# Patient Record
Sex: Female | Born: 1935 | Race: White | Hispanic: No | Marital: Single | Smoking: Never smoker
Health system: Southern US, Community
[De-identification: ages and names within clinical notes are randomized; demographics above are authoritative.]

## PROBLEM LIST (undated history)

## (undated) DIAGNOSIS — C50919 Malignant neoplasm of unspecified site of unspecified female breast: Secondary | ICD-10-CM

## (undated) HISTORY — PX: KNEE ARTHROSCOPY: SHX127

## (undated) HISTORY — PX: ABDOMINAL HYSTERECTOMY: SHX81

---
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
## 2008-08-12 DIAGNOSIS — C50919 Malignant neoplasm of unspecified site of unspecified female breast: Secondary | ICD-10-CM

## 2008-08-12 HISTORY — PX: MASTECTOMY: SHX3

## 2008-08-12 HISTORY — DX: Malignant neoplasm of unspecified site of unspecified female breast: C50.919

## 2019-06-06 ENCOUNTER — Other Ambulatory Visit: Payer: Self-pay

## 2019-06-06 ENCOUNTER — Emergency Department (HOSPITAL_COMMUNITY)
Admission: EM | Admit: 2019-06-06 | Discharge: 2019-06-06 | Disposition: A | Discharge status: Home / Self Care | Payer: Medicare PPO | Attending: Emergency Medicine | Admitting: Emergency Medicine

## 2019-06-06 ENCOUNTER — Encounter (HOSPITAL_COMMUNITY): Payer: Self-pay

## 2019-06-06 ENCOUNTER — Emergency Department (HOSPITAL_COMMUNITY): Payer: Medicare PPO

## 2019-06-06 DIAGNOSIS — W19XXXA Unspecified fall, initial encounter: Secondary | ICD-10-CM

## 2019-06-06 DIAGNOSIS — Z853 Personal history of malignant neoplasm of breast: Secondary | ICD-10-CM | POA: Insufficient documentation

## 2019-06-06 DIAGNOSIS — R519 Headache, unspecified: Secondary | ICD-10-CM | POA: Diagnosis present

## 2019-06-06 DIAGNOSIS — W010XXA Fall on same level from slipping, tripping and stumbling without subsequent striking against object, initial encounter: Secondary | ICD-10-CM | POA: Insufficient documentation

## 2019-06-06 DIAGNOSIS — Y92009 Unspecified place in unspecified non-institutional (private) residence as the place of occurrence of the external cause: Secondary | ICD-10-CM

## 2019-06-06 DIAGNOSIS — R531 Weakness: Secondary | ICD-10-CM | POA: Diagnosis not present

## 2019-06-06 DIAGNOSIS — Z20828 Contact with and (suspected) exposure to other viral communicable diseases: Secondary | ICD-10-CM | POA: Diagnosis not present

## 2019-06-06 DIAGNOSIS — M47812 Spondylosis without myelopathy or radiculopathy, cervical region: Secondary | ICD-10-CM | POA: Insufficient documentation

## 2019-06-06 DIAGNOSIS — I959 Hypotension, unspecified: Secondary | ICD-10-CM | POA: Insufficient documentation

## 2019-06-06 DIAGNOSIS — Z96652 Presence of left artificial knee joint: Secondary | ICD-10-CM | POA: Diagnosis not present

## 2019-06-06 DIAGNOSIS — Z9181 History of falling: Secondary | ICD-10-CM | POA: Diagnosis not present

## 2019-06-06 DIAGNOSIS — N3001 Acute cystitis with hematuria: Secondary | ICD-10-CM | POA: Insufficient documentation

## 2019-06-06 DIAGNOSIS — N3000 Acute cystitis without hematuria: Secondary | ICD-10-CM

## 2019-06-06 DIAGNOSIS — M25559 Pain in unspecified hip: Secondary | ICD-10-CM | POA: Diagnosis not present

## 2019-06-06 LAB — URINALYSIS, ROUTINE W REFLEX MICROSCOPIC
Bilirubin Urine: NEGATIVE
Glucose, UA: NEGATIVE mg/dL
Ketones, ur: 80 mg/dL — AB
Nitrite: POSITIVE — AB
Protein, ur: NEGATIVE mg/dL
Specific Gravity, Urine: 1.026 (ref 1.005–1.030)
pH: 6 (ref 5.0–8.0)

## 2019-06-06 LAB — CBC WITH DIFFERENTIAL/PLATELET
Abs Immature Granulocytes: 0.04 10*3/uL (ref 0.00–0.07)
Basophils Absolute: 0 10*3/uL (ref 0.0–0.1)
Basophils Relative: 0 %
Eosinophils Absolute: 0.1 10*3/uL (ref 0.0–0.5)
Eosinophils Relative: 1 %
HCT: 44.5 % (ref 36.0–46.0)
Hemoglobin: 14.3 g/dL (ref 12.0–15.0)
Immature Granulocytes: 0 %
Lymphocytes Relative: 15 %
Lymphs Abs: 1.4 10*3/uL (ref 0.7–4.0)
MCH: 29.6 pg (ref 26.0–34.0)
MCHC: 32.1 g/dL (ref 30.0–36.0)
MCV: 92.1 fL (ref 80.0–100.0)
Monocytes Absolute: 0.9 10*3/uL (ref 0.1–1.0)
Monocytes Relative: 9 %
Neutro Abs: 7 10*3/uL (ref 1.7–7.7)
Neutrophils Relative %: 75 %
Platelets: 258 10*3/uL (ref 150–400)
RBC: 4.83 MIL/uL (ref 3.87–5.11)
RDW: 13.5 % (ref 11.5–15.5)
WBC: 9.3 10*3/uL (ref 4.0–10.5)
nRBC: 0 % (ref 0.0–0.2)

## 2019-06-06 LAB — COMPREHENSIVE METABOLIC PANEL
ALT: 21 U/L (ref 0–44)
AST: 31 U/L (ref 15–41)
Albumin: 3.3 g/dL — ABNORMAL LOW (ref 3.5–5.0)
Alkaline Phosphatase: 68 U/L (ref 38–126)
Anion gap: 13 (ref 5–15)
BUN: 16 mg/dL (ref 8–23)
CO2: 22 mmol/L (ref 22–32)
Calcium: 9.1 mg/dL (ref 8.9–10.3)
Chloride: 103 mmol/L (ref 98–111)
Creatinine, Ser: 0.89 mg/dL (ref 0.44–1.00)
GFR calc Af Amer: >60 mL/min (ref >60)
GFR calc non Af Amer: 57 mL/min — ABNORMAL LOW (ref >60)
Glucose, Bld: 95 mg/dL (ref 70–99)
Potassium: 4.1 mmol/L (ref 3.5–5.1)
Sodium: 138 mmol/L (ref 135–145)
Total Bilirubin: 0.9 mg/dL (ref 0.3–1.2)
Total Protein: 6.8 g/dL (ref 6.5–8.1)

## 2019-06-06 LAB — CK: Total CK: 369 U/L — ABNORMAL HIGH (ref 38–234)

## 2019-06-06 LAB — SARS CORONAVIRUS 2 (TAT 6-24 HRS): SARS Coronavirus 2: NEGATIVE

## 2019-06-06 LAB — LIPASE, BLOOD: Lipase: 17 U/L (ref 11–51)

## 2019-06-06 MED ORDER — CEPHALEXIN 250 MG PO CAPS
500.0000 mg | ORAL_CAPSULE | Freq: Once | ORAL | Status: AC
  Administered 2019-06-06: 500 mg via ORAL
  Filled 2019-06-06: qty 2

## 2019-06-06 MED ORDER — CEPHALEXIN 500 MG PO CAPS: 500.0000 mg | ORAL_CAPSULE | Freq: Two times a day (BID) | ORAL | 0 refills | Status: AC

## 2019-06-06 MED ORDER — SODIUM CHLORIDE 0.9 % IV BOLUS
500.0000 mL | Freq: Once | INTRAVENOUS | Status: AC
  Administered 2019-06-06: 500 mL via INTRAVENOUS

## 2019-06-06 MED ORDER — SODIUM CHLORIDE 0.9 % IV BOLUS
1000.0000 mL | Freq: Once | INTRAVENOUS | Status: AC
  Administered 2019-06-06: 1000 mL via INTRAVENOUS

## 2019-06-06 NOTE — ED Triage Notes (Signed)
Patient arrives via EMS due to having a fall at home. Per EMS, patient had been in the floor for 2 days after a fall (unknown cause). Patients daughter was trying to call patient but could not get an answer. Daughter went to patients apartment and patient couldn't get to the door. Fire department was called and had to force entry. Per ems, patient has positive orthostatic changes.   Patient arrives with bruising to left eye, left arm, and left leg.    Patient states that her daughter moved her here from [place] 1 year ago due to having frequent falls.

## 2019-06-06 NOTE — ED Notes (Signed)
Patient verbalizes understanding of discharge instructions. Opportunity for questioning and answers were provided. pt discharged from ED with daughter.   

## 2019-06-06 NOTE — ED Notes (Signed)
Pt aware that urine sample is needed.  

## 2019-06-06 NOTE — Evaluation (Signed)
Physical Therapy Evaluation Patient Details Name: Tricia Baldwin MRN: [id number] DOB: [birth year] Today's Date: 06/06/2019   History of Present Illness  Pt is a 85 y.o. F with no significant PMH who presents with fall and was down for 48 hours. No apparent injuries or rhabdo.  Clinical Impression  Pt admitted with above. Pt presents with mild confusion, generalized weakness and dynamic balance impairments. Ambulating 150 feet with handheld assist. Recommended walker for all mobility, increased supervision/assist, and Life Alert. Also would benefit from HHPT to maximize functional independence. Orthostatic vitals taken and listed below.  Orthostatic Vital Signs: Supine: 187/78 (101) Sitting: 185/80 (107) Standing: 168/91 (112)    Follow Up Recommendations Home health PT;Supervision for mobility/OOB    Equipment Recommendations  None recommended by PT    Recommendations for Other Services       Precautions / Restrictions Precautions Precautions: Fall Restrictions Weight Bearing Restrictions: No      Mobility  Bed Mobility Overal bed mobility: Needs Assistance Bed Mobility: Supine to Sit;Sit to Supine     Supine to sit: Min assist Sit to supine: Supervision   General bed mobility comments: MinA to pull up to sit  Transfers Overall transfer level: Needs assistance   Transfers: Sit to/from Stand Sit to Stand: Supervision            Ambulation/Gait Ambulation/Gait assistance: Min guard Gait Distance (Feet): 120 Feet Assistive device: 1 person hand held assist Gait Pattern/deviations: Step-through pattern;Decreased stride length Gait velocity: decreased   General Gait Details: Pt mildly unsteady, requiring handheld assist  Stairs            Wheelchair Mobility    Modified Rankin (Stroke Patients Only)       Balance Overall balance assessment: Mild deficits observed, not formally tested                                            Pertinent Vitals/Pain Pain Assessment: No/denies pain    Home Living Family/patient expects to be discharged to:: Private residence Living Arrangements: Alone Available Help at Discharge: Family;Available PRN/intermittently Type of Home: Apartment Home Access: Stairs to enter   Entrance Stairs-Number of Steps: 1 Home Layout: One level Home Equipment: Grab bars - tub/shower;Walker - 4 wheels;Cane - quad;Walker - 2 wheels      Prior Function Level of Independence: Independent               Hand Dominance        Extremity/Trunk Assessment   Upper Extremity Assessment Upper Extremity Assessment: Generalized weakness    Lower Extremity Assessment Lower Extremity Assessment: Generalized weakness       Communication   Communication: No difficulties  Cognition Arousal/Alertness: Awake/alert Behavior During Therapy: WFL for tasks assessed/performed Overall Cognitive Status: Impaired/Different from baseline Area of Impairment: Memory                     Memory: Decreased short-term memory                General Comments      Exercises     Assessment/Plan    PT Assessment Patient needs continued PT services  PT Problem List Decreased strength;Decreased activity tolerance;Decreased balance;Decreased mobility       PT Treatment Interventions DME instruction;Gait training;Functional mobility training;Therapeutic activities;Therapeutic exercise;Patient/family education;Balance training    PT Goals (Current goals can be found  in the Care Plan section)  Acute Rehab PT Goals Patient Stated Goal: "go home." PT Goal Formulation: With patient Time For Goal Achievement: 06/20/19 Potential to Achieve Goals: Good    Frequency Min 3X/week   Barriers to discharge Decreased caregiver support      Co-evaluation               AM-PAC PT "6 Clicks" Mobility  Outcome Measure Help needed turning from your back to your side while in a flat bed  without using bedrails?: None Help needed moving from lying on your back to sitting on the side of a flat bed without using bedrails?: A Little Help needed moving to and from a bed to a chair (including a wheelchair)?: A Little Help needed standing up from a chair using your arms (e.g., wheelchair or bedside chair)?: None Help needed to walk in hospital room?: A Little Help needed climbing 3-5 steps with a railing? : A Little 6 Click Score: 20    End of Session Equipment Utilized During Treatment: Gait belt Activity Tolerance: Patient tolerated treatment well Patient left: in bed;with call bell/phone within reach;with family/visitor present Nurse Communication: Mobility status PT Visit Diagnosis: Unsteadiness on feet (R26.81);History of falling (Z91.81)    Time: 1601-1630 PT Time Calculation (min) (ACUTE ONLY): 29 min   Charges:   PT Evaluation $PT Eval Moderate Complexity: 1 Mod PT Treatments $Therapeutic Activity: 8-22 mins        Tricia Baldwin, PT, DPT Acute Rehabilitation Services Pager [phone] Office [phone]   Tricia Baldwin 06/06/2019, 5:09 PM

## 2019-06-06 NOTE — ED Notes (Signed)
Pt ambulated with the assistance of a walker. She stated she felt "sore but okay." Denied dizziness, lightheadedness, or SOB.

## 2019-06-06 NOTE — Consult Note (Signed)
ER Consult Note   Tricia Baldwin [id number] DOB: July 07, 1927 DOA: 06/06/2019  PCP: No primary care provider on file. - her doctors are in [facility]; has lived here a year but kept insurance there; saw PCP Thursday, Dr. Andee Lineman Consultants: None Patient coming from:  Home - lives alone; NOK: Daughter, [phone]  Chief Complaint: falls  HPI: Tricia Baldwin is a 85 y.o. female with no medical history presenting with falls.   She reports that she fell and hurt her hip, left leg, knee, and eye.  She is unsure why she fell.  She fell in the bedroom about 420AM Friday.  She was down the entire 48+ hours.  Now, her hip hurts but otherwise nothing is bothering her.  Her daughter has noticed that she is steady with level walking but is slower than she used to be.    ED Course:  Moved here a year ago due to falls at home, moved to be closer to family.  Mechanical fall Friday at 0400, unable to get up or get to the phone.  Broke into house and found her.  Bruising left face, knee.  No apparent injuries.  No rhabdo.  Likely needs PT/placement.  Review of Systems: As per HPI; otherwise review of systems reviewed and negative.   Ambulatory Status:  Ambulates without assistance  Past Medical History:  Diagnosis Date   Breast cancer ([place]) 2010    Past Surgical History:  Procedure Laterality Date   ABDOMINAL HYSTERECTOMY     in her 20's   KNEE ARTHROSCOPY Bilateral    MASTECTOMY  2010    Social History   Socioeconomic History   Marital status: Single    Spouse name: Not on file   Number of children: Not on file   Years of education: Not on file   Highest education level: Not on file  Occupational History   Occupation: retired  Scientist, product/process development strain: Not on file   Food insecurity    Worry: Not on file    Inability: Not on Lexicographer needs    Medical: Not on file    Non-medical: Not on file  Tobacco Use   Smoking status: Never  Smoker   Smokeless tobacco: Never Used  Substance and Sexual Activity   Alcohol use: Not Currently   Drug use: Never   Sexual activity: Not on file  Lifestyle   Physical activity    Days per week: Not on file    Minutes per session: Not on file   Stress: Not on file  Relationships   Social connections    Talks on phone: Not on file    Gets together: Not on file    Attends religious service: Not on file    Active member of club or organization: Not on file    Attends meetings of clubs or organizations: Not on file    Relationship status: Not on file   Intimate partner violence    Fear of current or ex partner: Not on file    Emotionally abused: Not on file    Physically abused: Not on file    Forced sexual activity: Not on file  Other Topics Concern   Not on file  Social History Narrative   Not on file    No Known Allergies  History reviewed. No pertinent family history.  Prior to Admission medications   Not on File    Physical Exam: Vitals:   06/06/19 1215 06/06/19  1217 06/06/19 1233 06/06/19 1419  BP: 118/76   (!) 162/70  Pulse: 72   85  Resp: 18   (!) 21  Temp:      TempSrc:      SpO2: 100%  100% 100%  Weight:  86.6 kg    Height:  5\' 2"  (1.575 m)        General:  Appears calm and comfortable and is NAD  Eyes:  PERRL, EOMI, normal lids, iris  ENT:  grossly normal hearing, lips & tongue, mmm; artificial dentition with 1 absent tooth,  chronic  Neck:  no LAD, masses or thyromegaly  Cardiovascular:  RRR, no m/r/g. No LE edema.   Respiratory:   CTA bilaterally with no wheezes/rales/rhonchi.  Normal respiratory effort.  Abdomen:  soft, NT, ND, NABS  Back:   normal alignment, no CVAT  Skin:  Scattered ecchymoses, excoriations, abrasions - none concerning  Musculoskeletal:  grossly normal tone BUE/BLE, good ROM, no bony abnormality  Psychiatric:  grossly normal mood and affect, speech fluent and appropriate, AOx3  Neurologic:  CN 2-12  grossly intact, moves all extremities in coordinated fashion    Radiological Exams on Admission: Dg Chest 2 View  Result Date: 06/06/2019 CLINICAL DATA:  Fall EXAM: CHEST - 2 VIEW COMPARISON:  None. FINDINGS: Cardiomegaly. Both lungs are clear. The visualized skeletal structures are unremarkable. Surgical clips in the right axilla and about the left breast. IMPRESSION: Cardiomegaly without acute abnormality of the lungs. Electronically Signed   By: Eddie Candle M.D.   On: 06/06/2019 14:17   Ct Head Wo Contrast  Result Date: 06/06/2019 CLINICAL DATA:  Altered level of consciousness, status post fall EXAM: CT HEAD WITHOUT CONTRAST CT MAXILLOFACIAL WITHOUT CONTRAST CT CERVICAL SPINE WITHOUT CONTRAST TECHNIQUE: Multidetector CT imaging of the head, cervical spine, and maxillofacial structures were performed using the standard protocol without intravenous contrast. Multiplanar CT image reconstructions of the cervical spine and maxillofacial structures were also generated. COMPARISON:  None. FINDINGS: CT HEAD FINDINGS Brain: No evidence of acute infarction, hemorrhage, extra-axial collection, ventriculomegaly, or mass effect. Generalized cerebral atrophy. Periventricular white matter low attenuation likely secondary to microangiopathy. Vascular: Cerebrovascular atherosclerotic calcifications are noted. Skull: Negative for fracture or focal lesion. Sinuses/Orbits: Visualized portions of the orbits are unremarkable. Visualized portions of the paranasal sinuses and mastoid air cells are unremarkable. Other: None. CT MAXILLOFACIAL FINDINGS Osseous: No fracture or mandibular dislocation. No destructive process. Orbits: Negative. No traumatic or inflammatory finding. Sinuses: No paranasal sinus air-fluid level. Small left maxillary sinus mucous retention cyst. Soft tissues: Mild soft tissue contusion above left orbit. No fluid collection or hematoma. CT CERVICAL SPINE FINDINGS Alignment: 3 mm anterolisthesis of C3  on C4 secondary to facet disease. Skull base and vertebrae: No acute fracture. No primary bone lesion or focal pathologic process. Soft tissues and spinal canal: No prevertebral fluid or swelling. No visible canal hematoma. Disc levels: Degenerative disease with disc height loss at C4-5, C5-6 and C6-7. At C2-3 there is severe right facet arthropathy. At C3-4 there is severe left facet arthropathy. At C4-5 there is a broad-based disc osteophyte complex with mild right foraminal narrowing. At C5-6 there is a broad-based disc osteophyte complex. Upper chest: Lung apices are clear. Other: 14 mm left thyroid hypodense nodule likely reflecting a colloid cyst. IMPRESSION: 1. No acute intracranial pathology. 2.  No acute osseous injury of the maxillofacial bones. 3.  No acute osseous injury of the cervical spine. 4. Cervical spine spondylosis as described above. Electronically Signed  By: Kathreen Devoid   On: 06/06/2019 13:40   Ct Cervical Spine Wo Contrast  Result Date: 06/06/2019 CLINICAL DATA:  Altered level of consciousness, status post fall EXAM: CT HEAD WITHOUT CONTRAST CT MAXILLOFACIAL WITHOUT CONTRAST CT CERVICAL SPINE WITHOUT CONTRAST TECHNIQUE: Multidetector CT imaging of the head, cervical spine, and maxillofacial structures were performed using the standard protocol without intravenous contrast. Multiplanar CT image reconstructions of the cervical spine and maxillofacial structures were also generated. COMPARISON:  None. FINDINGS: CT HEAD FINDINGS Brain: No evidence of acute infarction, hemorrhage, extra-axial collection, ventriculomegaly, or mass effect. Generalized cerebral atrophy. Periventricular white matter low attenuation likely secondary to microangiopathy. Vascular: Cerebrovascular atherosclerotic calcifications are noted. Skull: Negative for fracture or focal lesion. Sinuses/Orbits: Visualized portions of the orbits are unremarkable. Visualized portions of the paranasal sinuses and mastoid air  cells are unremarkable. Other: None. CT MAXILLOFACIAL FINDINGS Osseous: No fracture or mandibular dislocation. No destructive process. Orbits: Negative. No traumatic or inflammatory finding. Sinuses: No paranasal sinus air-fluid level. Small left maxillary sinus mucous retention cyst. Soft tissues: Mild soft tissue contusion above left orbit. No fluid collection or hematoma. CT CERVICAL SPINE FINDINGS Alignment: 3 mm anterolisthesis of C3 on C4 secondary to facet disease. Skull base and vertebrae: No acute fracture. No primary bone lesion or focal pathologic process. Soft tissues and spinal canal: No prevertebral fluid or swelling. No visible canal hematoma. Disc levels: Degenerative disease with disc height loss at C4-5, C5-6 and C6-7. At C2-3 there is severe right facet arthropathy. At C3-4 there is severe left facet arthropathy. At C4-5 there is a broad-based disc osteophyte complex with mild right foraminal narrowing. At C5-6 there is a broad-based disc osteophyte complex. Upper chest: Lung apices are clear. Other: 14 mm left thyroid hypodense nodule likely reflecting a colloid cyst. IMPRESSION: 1. No acute intracranial pathology. 2.  No acute osseous injury of the maxillofacial bones. 3.  No acute osseous injury of the cervical spine. 4. Cervical spine spondylosis as described above. Electronically Signed   By: Kathreen Devoid   On: 06/06/2019 13:40   Dg Knee Complete 4 Views Left  Result Date: 06/06/2019 CLINICAL DATA:  Status post fall 2 days ago. Left hip pain and leg pain. EXAM: LEFT KNEE - COMPLETE 4+ VIEW COMPARISON:  None. FINDINGS: The left knee demonstrates a total knee arthroplasty without evidence of hardware failure complication. There is no significant joint effusion. There is no fracture or dislocation. The alignment is anatomic. There is no joint effusion. IMPRESSION: Left total knee arthroplasty. No acute osseous injury of the left knee. Electronically Signed   By: Kathreen Devoid   On:  06/06/2019 14:22   Dg Hip Unilat With Pelvis 2-3 Views Left  Result Date: 06/06/2019 CLINICAL DATA:  Pain following fall EXAM: DG HIP (WITH OR WITHOUT PELVIS) 2-3V LEFT COMPARISON:  None. FINDINGS: Frontal pelvis as well as frontal and lateral left hip images were obtained. There is no fracture or dislocation. There is moderate narrowing of each hip joint, stable. No erosive change. Sacroiliac joints appear normal bilaterally. IMPRESSION: No evident fracture or dislocation. Symmetric narrowing of each hip joint. Electronically Signed   By: Lowella Grip III M.D.   On: 06/06/2019 14:20   Ct Maxillofacial Wo Contrast  Result Date: 06/06/2019 CLINICAL DATA:  Altered level of consciousness, status post fall EXAM: CT HEAD WITHOUT CONTRAST CT MAXILLOFACIAL WITHOUT CONTRAST CT CERVICAL SPINE WITHOUT CONTRAST TECHNIQUE: Multidetector CT imaging of the head, cervical spine, and maxillofacial structures were performed using the  standard protocol without intravenous contrast. Multiplanar CT image reconstructions of the cervical spine and maxillofacial structures were also generated. COMPARISON:  None. FINDINGS: CT HEAD FINDINGS Brain: No evidence of acute infarction, hemorrhage, extra-axial collection, ventriculomegaly, or mass effect. Generalized cerebral atrophy. Periventricular white matter low attenuation likely secondary to microangiopathy. Vascular: Cerebrovascular atherosclerotic calcifications are noted. Skull: Negative for fracture or focal lesion. Sinuses/Orbits: Visualized portions of the orbits are unremarkable. Visualized portions of the paranasal sinuses and mastoid air cells are unremarkable. Other: None. CT MAXILLOFACIAL FINDINGS Osseous: No fracture or mandibular dislocation. No destructive process. Orbits: Negative. No traumatic or inflammatory finding. Sinuses: No paranasal sinus air-fluid level. Small left maxillary sinus mucous retention cyst. Soft tissues: Mild soft tissue contusion above  left orbit. No fluid collection or hematoma. CT CERVICAL SPINE FINDINGS Alignment: 3 mm anterolisthesis of C3 on C4 secondary to facet disease. Skull base and vertebrae: No acute fracture. No primary bone lesion or focal pathologic process. Soft tissues and spinal canal: No prevertebral fluid or swelling. No visible canal hematoma. Disc levels: Degenerative disease with disc height loss at C4-5, C5-6 and C6-7. At C2-3 there is severe right facet arthropathy. At C3-4 there is severe left facet arthropathy. At C4-5 there is a broad-based disc osteophyte complex with mild right foraminal narrowing. At C5-6 there is a broad-based disc osteophyte complex. Upper chest: Lung apices are clear. Other: 14 mm left thyroid hypodense nodule likely reflecting a colloid cyst. IMPRESSION: 1. No acute intracranial pathology. 2.  No acute osseous injury of the maxillofacial bones. 3.  No acute osseous injury of the cervical spine. 4. Cervical spine spondylosis as described above. Electronically Signed   By: Kathreen Devoid   On: 06/06/2019 13:40    EKG: Independently reviewed.  NSR with rate 81; nonspecific ST changes with no evidence of acute ischemia   Labs on Admission: I have personally reviewed the available labs and imaging studies at the time of the admission.  Pertinent labs:   Unremarkable CMP CK 369 Normal CBC  Assessment/Plan Principal Problem:   Fall at home, initial encounter   -Remarkably healthy 85yo with recent "slowing down", intermittent falls for which she moved to [place] a year ago to live near her daughter -She reports falling over 48 hours ago and being unable to get up -Remarkably, no apparent injuries, no rhabdo, no AKI -She is cognitively intact and reports a willingness to return home independently vs. With her daughter (who says that can't happen) -As such, it appears that the risk of hospitalization is at least as great as the risk of returning home -Nursing staff asked to ambulate  patient; if able to ambulate, suggest d/c to home with possible [facility] evaluation and close family supervision -If unable to ambulate, could observe overnight with PT/OT evaluation and possible placement -I considered CT of her hip, but given full ROM and negative x-ray, will defer at this time. -Patient and her daughter voice understanding and agreement with this plan.   Thank you for this interesting consultation.  [facility] will sign off at this time.  If additional circumstances arise that necessitate intervention, please reconsult.  Karmen Bongo MD [facility]   How to contact the [facility] Attending or Consulting provider [place] or covering provider during after hours [place], for this patient?  1. Check the care team in [facility] and look for a) attending/consulting [facility] provider listed and b) the [facility] team listed 2. Log into [email or web address] and use [place] universal password to access.  If you do not have the password, please contact the hospital operator. 3. Locate the [facility] provider you are looking for under [facility] and page to a number that you can be directly reached. 4. If you still have difficulty reaching the provider, please page the [facility] (Director on Call) for the Hospitalists listed on amion for assistance.   06/06/2019, 3:37 PM

## 2019-06-06 NOTE — ED Provider Notes (Addendum)
[facility] EMERGENCY DEPARTMENT Provider Note   CSN: [id number] Arrival date & time: 06/06/19  1156     History   Chief Complaint Chief Complaint  Patient presents with   Fall    HPI Tricia Baldwin is a 85 y.o. female.     Patient presents to the ED after a fall.  Patient lives by herself.  Was last seen about 48 hours ago.  Patient states that she remembers using the bathroom and then fallen and being unable to get up.  Family at the bedside states that she likely fell around Friday morning at 4 AM which is over 48 hours ago.  They were unable to get in touch with her today and EMS and fire had to break into her apartment.  Does not take any blood thinners.  Had positive orthostatics with EMS.  Mildly hypotension on arrival here with blood pressure 89/72.  However normal mentation.  Patient states that she has a little left-sided head pain.  Otherwise does not have any pain.  Denies any chest pain, shortness of breath.  Patient states that she was too weak to get up.  Does have a walker and a wheelchair at home that she uses intermittently.  The history is provided by the patient.  Fall This is a new problem. The current episode started 2 days ago. The problem occurs constantly. The problem has been resolved. Pertinent negatives include no chest pain, no abdominal pain, no headaches and no shortness of breath. Nothing aggravates the symptoms. Nothing relieves the symptoms. She has tried nothing for the symptoms. The treatment provided no relief.    Past Medical History:  Diagnosis Date   Breast cancer [facility]) 2010    Patient Active Problem List   Diagnosis Date Noted   Fall at home, initial encounter 06/06/2019    Past Surgical History:  Procedure Laterality Date   ABDOMINAL HYSTERECTOMY     in her 20's   KNEE ARTHROSCOPY Bilateral    MASTECTOMY  2010     OB History   No obstetric history on file.      Home Medications    Prior to  Admission medications   Medication Sig Start Date End Date Taking? Authorizing Provider  cephALEXin (KEFLEX) 500 MG capsule Take 1 capsule (500 mg total) by mouth 2 (two) times daily for 7 days. 06/06/19 06/13/19  Lennice Sites, DO    Family History History reviewed. No pertinent family history.  Social History Social History   Tobacco Use   Smoking status: Never Smoker   Smokeless tobacco: Never Used  Substance Use Topics   Alcohol use: Not Currently   Drug use: Never     Allergies   Patient has no known allergies.   Review of Systems Review of Systems  Constitutional: Negative for chills and fever.  HENT: Negative for ear pain and sore throat.   Eyes: Negative for pain and visual disturbance.  Respiratory: Negative for cough and shortness of breath.   Cardiovascular: Negative for chest pain and palpitations.  Gastrointestinal: Negative for abdominal pain and vomiting.  Genitourinary: Negative for dysuria and hematuria.  Musculoskeletal: Positive for gait problem. Negative for arthralgias and back pain.  Skin: Negative for color change and rash.  Neurological: Positive for weakness. Negative for seizures, syncope and headaches.  All other systems reviewed and are negative.    Physical Exam Updated Vital Signs  ED Triage Vitals  Enc Vitals Group     BP 06/06/19 1211 Marland Kitchen)  89/72     Pulse Rate 06/06/19 1211 87     Resp 06/06/19 1211 (!) 22     Temp 06/06/19 1211 (!) 97.5 F (36.4 C)     Temp Source 06/06/19 1211 Rectal     SpO2 06/06/19 1211 100 %     Weight 06/06/19 1217 191 lb (86.6 kg)     Height 06/06/19 1217 5\' 2"  (1.575 m)     Head Circumference --      Peak Flow --      Pain Score 06/06/19 1217 0     Pain Loc --      Pain Edu? --      Excl. in [place]? --     Physical Exam Vitals signs and nursing note reviewed.  Constitutional:      General: She is not in acute distress.    Appearance: She is well-developed. She is obese. She is not ill-appearing.   HENT:     Head: Normocephalic and atraumatic.     Nose: Nose normal.     Mouth/Throat:     Mouth: Mucous membranes are moist.  Eyes:     Extraocular Movements: Extraocular movements intact.     Conjunctiva/sclera: Conjunctivae normal.     Pupils: Pupils are equal, round, and reactive to light.  Neck:     Musculoskeletal: Normal range of motion and neck supple.  Cardiovascular:     Rate and Rhythm: Normal rate and regular rhythm.     Pulses: Normal pulses.     Heart sounds: Normal heart sounds. No murmur.  Pulmonary:     Effort: Pulmonary effort is normal. No respiratory distress.     Breath sounds: Normal breath sounds.  Abdominal:     General: Abdomen is flat. There is no distension.     Palpations: Abdomen is soft.     Tenderness: There is no abdominal tenderness.  Musculoskeletal: Normal range of motion.        General: No tenderness.  Skin:    General: Skin is warm and dry.     Capillary Refill: Capillary refill takes less than 2 seconds.     Findings: Bruising present.     Comments: Bruising around the left eye, hemostatic abrasion to the left eyebrow  Neurological:     General: No focal deficit present.     Mental Status: She is alert and oriented to person, place, and time.     Cranial Nerves: No cranial nerve deficit.     Sensory: No sensory deficit.     Motor: No weakness.     Coordination: Coordination normal.     Comments: Appears to move all extremities with fairly good strength, normal sensation  Psychiatric:        Mood and Affect: Mood normal.      ED Treatments / Results  Labs (all labs ordered are listed, but only abnormal results are displayed) Labs Reviewed  COMPREHENSIVE METABOLIC PANEL - Abnormal; Notable for the following components:      Result Value   Albumin 3.3 (*)    GFR calc non Af Amer 57 (*)    All other components within normal limits  URINALYSIS, ROUTINE W REFLEX MICROSCOPIC - Abnormal; Notable for the following components:    APPearance HAZY (*)    Hgb urine dipstick SMALL (*)    Ketones, ur 80 (*)    Nitrite POSITIVE (*)    Leukocytes,Ua SMALL (*)    Bacteria, UA FEW (*)    All other  components within normal limits  CK - Abnormal; Notable for the following components:   Total CK 369 (*)    All other components within normal limits  URINE CULTURE  SARS CORONAVIRUS 2 (TAT 6-24 HRS)  CBC WITH DIFFERENTIAL/PLATELET  LIPASE, BLOOD    EKG EKG Interpretation  Date/Time:  Sunday June 06 2019 12:33:04 EDT Ventricular Rate:  81 PR Interval:    QRS Duration: 96 QT Interval:  405 QTC Calculation: 471 R Axis:   -43 Text Interpretation:  Sinus rhythm Left axis deviation Confirmed by Lennice Sites [phone]) on 06/06/2019 1:00:12 PM   Radiology Dg Chest 2 View  Result Date: 06/06/2019 CLINICAL DATA:  Fall EXAM: CHEST - 2 VIEW COMPARISON:  None. FINDINGS: Cardiomegaly. Both lungs are clear. The visualized skeletal structures are unremarkable. Surgical clips in the right axilla and about the left breast. IMPRESSION: Cardiomegaly without acute abnormality of the lungs. Electronically Signed   By: Eddie Candle M.D.   On: 06/06/2019 14:17   Ct Head Wo Contrast  Result Date: 06/06/2019 CLINICAL DATA:  Altered level of consciousness, status post fall EXAM: CT HEAD WITHOUT CONTRAST CT MAXILLOFACIAL WITHOUT CONTRAST CT CERVICAL SPINE WITHOUT CONTRAST TECHNIQUE: Multidetector CT imaging of the head, cervical spine, and maxillofacial structures were performed using the standard protocol without intravenous contrast. Multiplanar CT image reconstructions of the cervical spine and maxillofacial structures were also generated. COMPARISON:  None. FINDINGS: CT HEAD FINDINGS Brain: No evidence of acute infarction, hemorrhage, extra-axial collection, ventriculomegaly, or mass effect. Generalized cerebral atrophy. Periventricular white matter low attenuation likely secondary to microangiopathy. Vascular: Cerebrovascular  atherosclerotic calcifications are noted. Skull: Negative for fracture or focal lesion. Sinuses/Orbits: Visualized portions of the orbits are unremarkable. Visualized portions of the paranasal sinuses and mastoid air cells are unremarkable. Other: None. CT MAXILLOFACIAL FINDINGS Osseous: No fracture or mandibular dislocation. No destructive process. Orbits: Negative. No traumatic or inflammatory finding. Sinuses: No paranasal sinus air-fluid level. Small left maxillary sinus mucous retention cyst. Soft tissues: Mild soft tissue contusion above left orbit. No fluid collection or hematoma. CT CERVICAL SPINE FINDINGS Alignment: 3 mm anterolisthesis of C3 on C4 secondary to facet disease. Skull base and vertebrae: No acute fracture. No primary bone lesion or focal pathologic process. Soft tissues and spinal canal: No prevertebral fluid or swelling. No visible canal hematoma. Disc levels: Degenerative disease with disc height loss at C4-5, C5-6 and C6-7. At C2-3 there is severe right facet arthropathy. At C3-4 there is severe left facet arthropathy. At C4-5 there is a broad-based disc osteophyte complex with mild right foraminal narrowing. At C5-6 there is a broad-based disc osteophyte complex. Upper chest: Lung apices are clear. Other: 14 mm left thyroid hypodense nodule likely reflecting a colloid cyst. IMPRESSION: 1. No acute intracranial pathology. 2.  No acute osseous injury of the maxillofacial bones. 3.  No acute osseous injury of the cervical spine. 4. Cervical spine spondylosis as described above. Electronically Signed   By: Kathreen Devoid   On: 06/06/2019 13:40   Ct Cervical Spine Wo Contrast  Result Date: 06/06/2019 CLINICAL DATA:  Altered level of consciousness, status post fall EXAM: CT HEAD WITHOUT CONTRAST CT MAXILLOFACIAL WITHOUT CONTRAST CT CERVICAL SPINE WITHOUT CONTRAST TECHNIQUE: Multidetector CT imaging of the head, cervical spine, and maxillofacial structures were performed using the standard  protocol without intravenous contrast. Multiplanar CT image reconstructions of the cervical spine and maxillofacial structures were also generated. COMPARISON:  None. FINDINGS: CT HEAD FINDINGS Brain: No evidence of acute infarction, hemorrhage, extra-axial collection,  ventriculomegaly, or mass effect. Generalized cerebral atrophy. Periventricular white matter low attenuation likely secondary to microangiopathy. Vascular: Cerebrovascular atherosclerotic calcifications are noted. Skull: Negative for fracture or focal lesion. Sinuses/Orbits: Visualized portions of the orbits are unremarkable. Visualized portions of the paranasal sinuses and mastoid air cells are unremarkable. Other: None. CT MAXILLOFACIAL FINDINGS Osseous: No fracture or mandibular dislocation. No destructive process. Orbits: Negative. No traumatic or inflammatory finding. Sinuses: No paranasal sinus air-fluid level. Small left maxillary sinus mucous retention cyst. Soft tissues: Mild soft tissue contusion above left orbit. No fluid collection or hematoma. CT CERVICAL SPINE FINDINGS Alignment: 3 mm anterolisthesis of C3 on C4 secondary to facet disease. Skull base and vertebrae: No acute fracture. No primary bone lesion or focal pathologic process. Soft tissues and spinal canal: No prevertebral fluid or swelling. No visible canal hematoma. Disc levels: Degenerative disease with disc height loss at C4-5, C5-6 and C6-7. At C2-3 there is severe right facet arthropathy. At C3-4 there is severe left facet arthropathy. At C4-5 there is a broad-based disc osteophyte complex with mild right foraminal narrowing. At C5-6 there is a broad-based disc osteophyte complex. Upper chest: Lung apices are clear. Other: 14 mm left thyroid hypodense nodule likely reflecting a colloid cyst. IMPRESSION: 1. No acute intracranial pathology. 2.  No acute osseous injury of the maxillofacial bones. 3.  No acute osseous injury of the cervical spine. 4. Cervical spine spondylosis  as described above. Electronically Signed   By: Kathreen Devoid   On: 06/06/2019 13:40   Dg Knee Complete 4 Views Left  Result Date: 06/06/2019 CLINICAL DATA:  Status post fall 2 days ago. Left hip pain and leg pain. EXAM: LEFT KNEE - COMPLETE 4+ VIEW COMPARISON:  None. FINDINGS: The left knee demonstrates a total knee arthroplasty without evidence of hardware failure complication. There is no significant joint effusion. There is no fracture or dislocation. The alignment is anatomic. There is no joint effusion. IMPRESSION: Left total knee arthroplasty. No acute osseous injury of the left knee. Electronically Signed   By: Kathreen Devoid   On: 06/06/2019 14:22   Dg Hip Unilat With Pelvis 2-3 Views Left  Result Date: 06/06/2019 CLINICAL DATA:  Pain following fall EXAM: DG HIP (WITH OR WITHOUT PELVIS) 2-3V LEFT COMPARISON:  None. FINDINGS: Frontal pelvis as well as frontal and lateral left hip images were obtained. There is no fracture or dislocation. There is moderate narrowing of each hip joint, stable. No erosive change. Sacroiliac joints appear normal bilaterally. IMPRESSION: No evident fracture or dislocation. Symmetric narrowing of each hip joint. Electronically Signed   By: Lowella Grip III M.D.   On: 06/06/2019 14:20   Ct Maxillofacial Wo Contrast  Result Date: 06/06/2019 CLINICAL DATA:  Altered level of consciousness, status post fall EXAM: CT HEAD WITHOUT CONTRAST CT MAXILLOFACIAL WITHOUT CONTRAST CT CERVICAL SPINE WITHOUT CONTRAST TECHNIQUE: Multidetector CT imaging of the head, cervical spine, and maxillofacial structures were performed using the standard protocol without intravenous contrast. Multiplanar CT image reconstructions of the cervical spine and maxillofacial structures were also generated. COMPARISON:  None. FINDINGS: CT HEAD FINDINGS Brain: No evidence of acute infarction, hemorrhage, extra-axial collection, ventriculomegaly, or mass effect. Generalized cerebral atrophy.  Periventricular white matter low attenuation likely secondary to microangiopathy. Vascular: Cerebrovascular atherosclerotic calcifications are noted. Skull: Negative for fracture or focal lesion. Sinuses/Orbits: Visualized portions of the orbits are unremarkable. Visualized portions of the paranasal sinuses and mastoid air cells are unremarkable. Other: None. CT MAXILLOFACIAL FINDINGS Osseous: No fracture or mandibular dislocation.  No destructive process. Orbits: Negative. No traumatic or inflammatory finding. Sinuses: No paranasal sinus air-fluid level. Small left maxillary sinus mucous retention cyst. Soft tissues: Mild soft tissue contusion above left orbit. No fluid collection or hematoma. CT CERVICAL SPINE FINDINGS Alignment: 3 mm anterolisthesis of C3 on C4 secondary to facet disease. Skull base and vertebrae: No acute fracture. No primary bone lesion or focal pathologic process. Soft tissues and spinal canal: No prevertebral fluid or swelling. No visible canal hematoma. Disc levels: Degenerative disease with disc height loss at C4-5, C5-6 and C6-7. At C2-3 there is severe right facet arthropathy. At C3-4 there is severe left facet arthropathy. At C4-5 there is a broad-based disc osteophyte complex with mild right foraminal narrowing. At C5-6 there is a broad-based disc osteophyte complex. Upper chest: Lung apices are clear. Other: 14 mm left thyroid hypodense nodule likely reflecting a colloid cyst. IMPRESSION: 1. No acute intracranial pathology. 2.  No acute osseous injury of the maxillofacial bones. 3.  No acute osseous injury of the cervical spine. 4. Cervical spine spondylosis as described above. Electronically Signed   By: Kathreen Devoid   On: 06/06/2019 13:40    Procedures Procedures (including critical care time)  Medications Ordered in ED Medications  cephALEXin (KEFLEX) capsule 500 mg (has no administration in time range)  sodium chloride 0.9 % bolus 1,000 mL (0 mLs Intravenous Stopped  06/06/19 1526)  sodium chloride 0.9 % bolus 500 mL (0 mLs Intravenous Stopped 06/06/19 1854)     Initial Impression / Assessment and Plan / ED Course  I have reviewed the triage vital signs and the nursing notes.  Pertinent labs & imaging results that were available during my care of the patient were reviewed by me and considered in my medical decision making (see chart for details).     Tricia Baldwin is a 85 year old female who presents the ED after fall at home.  Patient arrives with blood pressure of 82/72 but otherwise normal vitals.  Hypotension improved upon recheck.  Started on IV fluids.  It appears that patient likely was on the floor of her apartment for the last 48+ hours.  She lives by herself.  Family did not hear from her yesterday and tried to call this morning and they had to break into her house to find her.  Patient has bruising to the left eye, left knee.  Does not complain of any specific pain.  States that she was unable to get up as she does have ambulation issues at baseline.  She is not on a blood thinner.  Concern for injuries versus metabolic derangements including rhabdomyolysis, dehydration.  Will get lab work including CK, urinalysis.  Will get CT imaging of her head, neck, face.  We will get x-rays of her chest, pelvis, left knee.  Anticipate admission for hydration and likely placement.  CK in the 300s.  Otherwise no renal injury on lab work.  No significant anemia, electrolyte abnormality otherwise.  CT of her head, face, neck are unremarkable.  X-rays also show no acute injuries.  Overall patient has had some orthostasis, likely dehydrated clinically.  Likely decompensated given her length of stay on the floor.  We will have her evaluated by hospitalist for admission for hydration and PT OT.   Hospitalist recommends that we try to hydrate further, try to ambulate patient and will make shared decision about admission versus ED observation to have social work and case  management evaluate the patient for maybe home health from the  ED.  Patient was able to ambulate well with physical therapy.  She has a walker at home that they will make some adjustments to. Pt given keflex for UTI. No signs of sepsis.  Case management was consulted and will arrange for home health and physical therapy.  Patient can stay with family tonight.  Discharged in the ED in good condition.  Given return precautions.  This chart was dictated using voice recognition software.  Despite best efforts to proofread,  errors can occur which can change the documentation meaning.    Final Clinical Impressions(s) / ED Diagnoses   Final diagnoses:  Fall, initial encounter  Weakness  Acute cystitis without hematuria    ED Discharge Orders         Ordered    cephALEXin (KEFLEX) 500 MG capsule  2 times daily     06/06/19 2020           Lennice Sites, DO 06/06/19 [place], Maribel, DO 06/06/19 2021

## 2019-06-06 NOTE — Care Management (Addendum)
ED CM received call concerning [place] recommendation patient sustained a mechanical fall and will benefit from [facility] services. CM spoke with family who agrees with recommendation offered choice for the CMS compared list, no preferences verbalized, CM contact first on list spoke with Malachy Mood liaison at  [place], [facility] referral accepted. Noted that a nurse will contact patient at verified number at least 24-48 hrs post discharge to set up the intial visit, no further question or concerns verbalized teach back done.  Information placed on AVS, Updated Patty RN on [place].

## 2019-06-09 LAB — URINE CULTURE: Culture: >=100000 — AB

## 2019-06-10 ENCOUNTER — Telehealth (HOSPITAL_BASED_OUTPATIENT_CLINIC_OR_DEPARTMENT_OTHER): Payer: Self-pay

## 2019-06-10 NOTE — Telephone Encounter (Signed)
Post ED Visit - Positive Culture Follow-up  Culture report reviewed by antimicrobial stewardship pharmacist: [place] Team []  Elenor Quinones, Pharm.D. []  Heide Guile, Pharm.D., BCPS AQ-ID []  Parks Neptune, Pharm.D., BCPS []  Alycia Rossetti, Pharm.D., BCPS []  [place], Pharm.D., BCPS, AAHIVP []  Legrand Como, Pharm.D., BCPS, AAHIVP []  Salome Arnt, PharmD, BCPS []  Johnnette Gourd, PharmD, BCPS []  Hughes Better, PharmD, BCPS []  Leeroy Cha, PharmD []  Laqueta Linden, PharmD, BCPS     Albertina Parr, PharmD Velia Meyer B. Francesca Jewett, PharmD  [place] Team []  Leodis Sias, PharmD []  Lindell Spar, PharmD []  Royetta Asal, PharmD []  Graylin Shiver, Rph []  Rema Fendt) Glennon Mac, PharmD []  Arlyn Dunning, PharmD []  Netta Cedars, PharmD []  Dia Sitter, PharmD []  Leone Haven, PharmD []  Gretta Arab, PharmD []  Theodis Shove, PharmD []  Peggyann Juba, PharmD []  Reuel Boom, PharmD   Positive urine culture >/= 100,000 colonies Both Klebsiella Pneumoniae & Proteus Mirabilis Treated with Cephalexin, organism sensitive to the same and no further patient follow-up is required at this time.  Dortha Kern 06/10/2019, 10:40 AM

## 2021-03-30 IMAGING — CT CT HEAD W/O CM
4 series · 15 of 47 positions shown, 17 images · non-contrast
Comparison: None.

CLINICAL DATA: Altered level of consciousness, status post fall

EXAM:
CT HEAD WITHOUT CONTRAST
CT MAXILLOFACIAL WITHOUT CONTRAST
CT CERVICAL SPINE WITHOUT CONTRAST
TECHNIQUE: Multidetector CT imaging of the head, cervical spine, and
maxillofacial structures were performed using the standard protocol
without intravenous contrast. Multiplanar CT image reconstructions
of the cervical spine and maxillofacial structures were also
generated.

[Series 1: head without · axial · non-contrast · 0.41mm/px · z∈[-140,-26]mm · 7 of 31 slices shown, 9 images]
[im 4/31  brain]
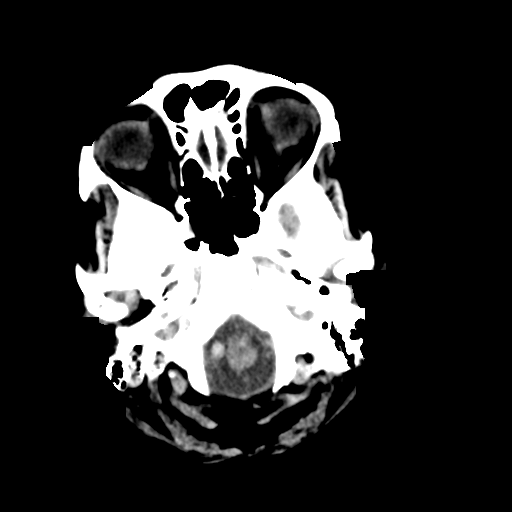
[im 4/31  bone]
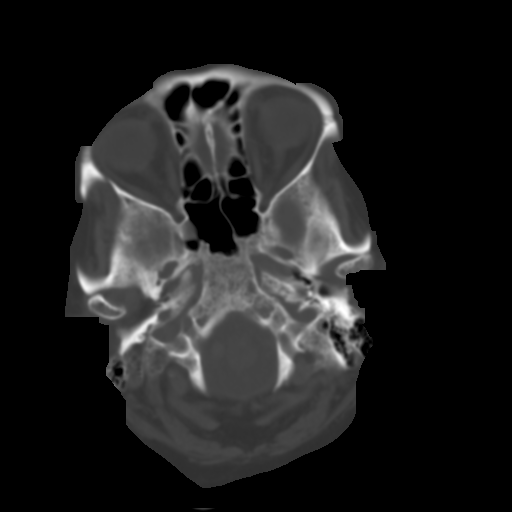
[im 8/31  brain]
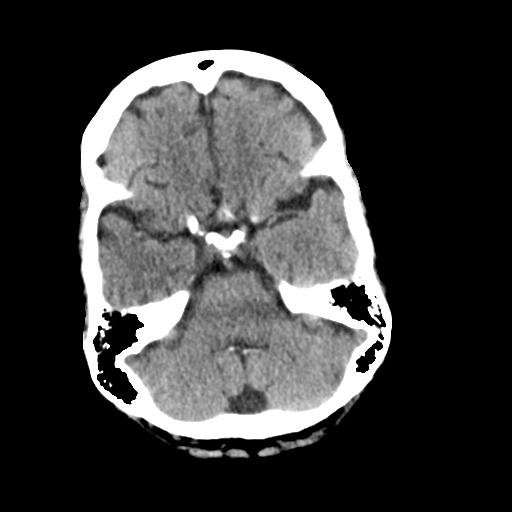
[im 12/31  brain]
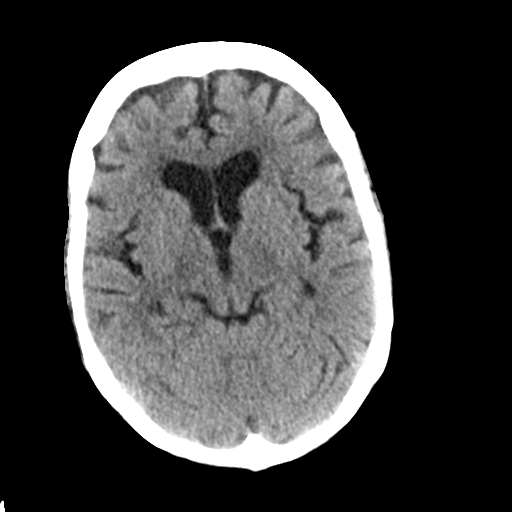
[im 16/31  brain]
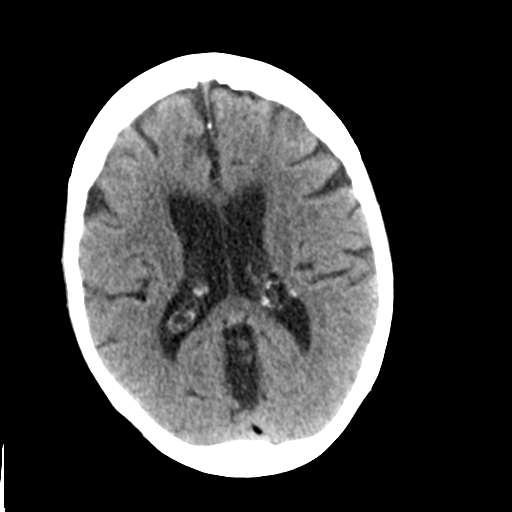
[im 19/31  brain]
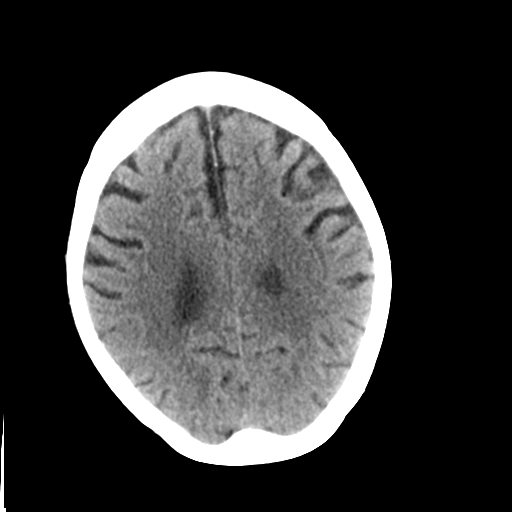
[im 19/31  bone]
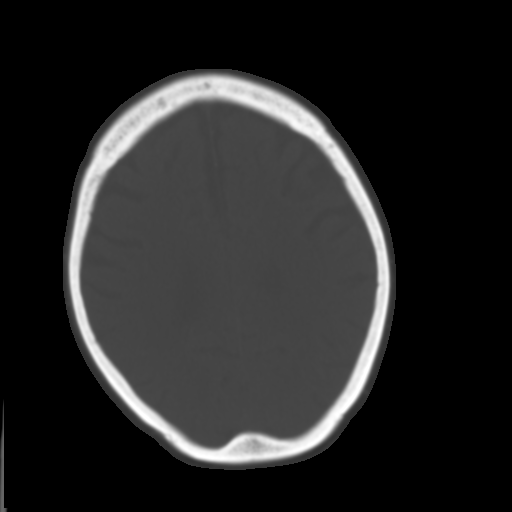
[im 23/31  brain]
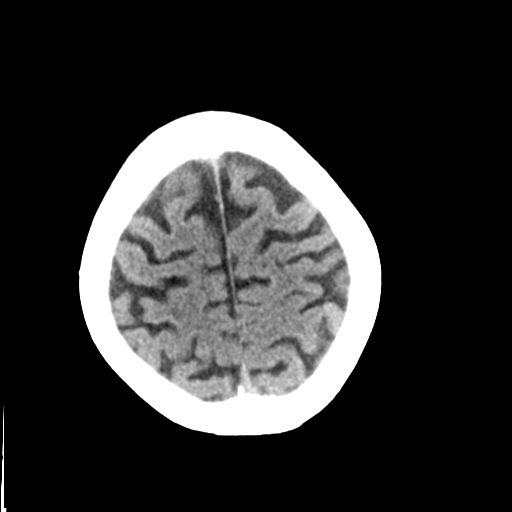
[im 27/31  brain]
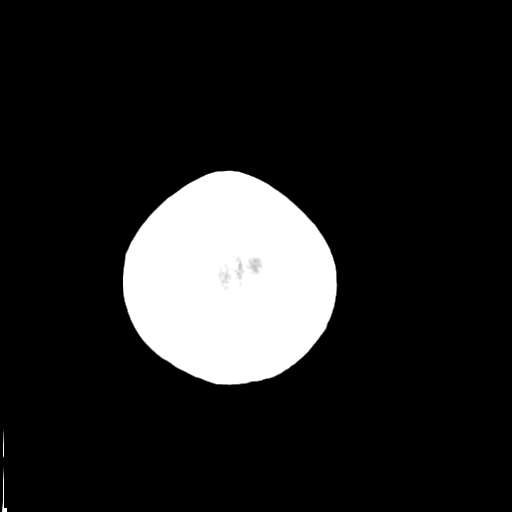

[Series 4: head bone · axial · 0.41mm/px · z∈[-142,-126]mm · 2 of 77 slices shown]
[im 8/77  bone]
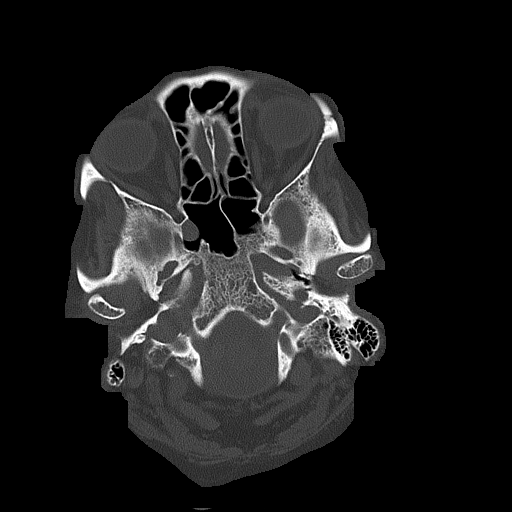
[im 16/77  bone]
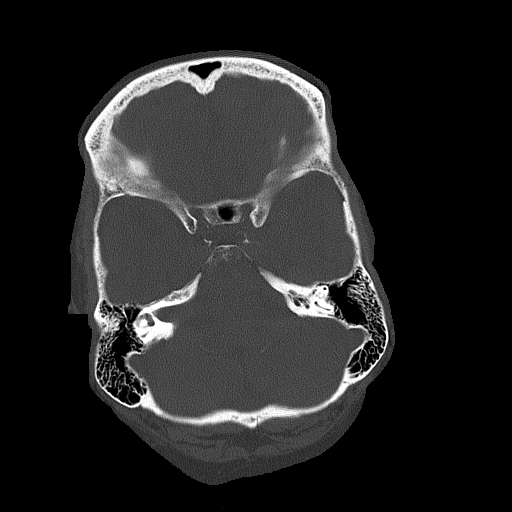

[Series 5: head without cor · coronal · non-contrast · 0.30mm/px · 3 of 65 slices shown]
[im 22/65  brain]
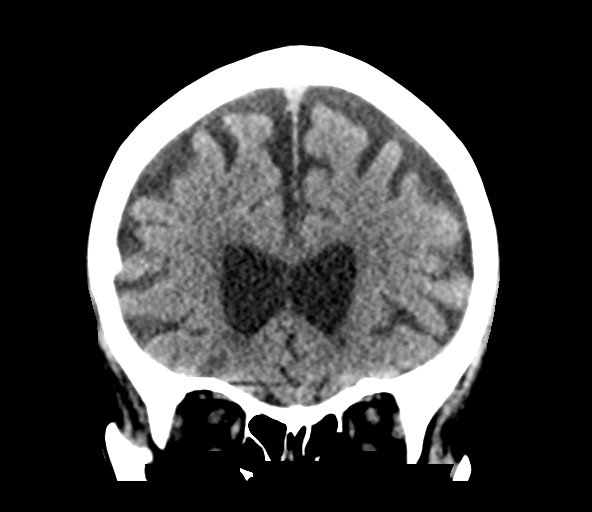
[im 29/65  brain]
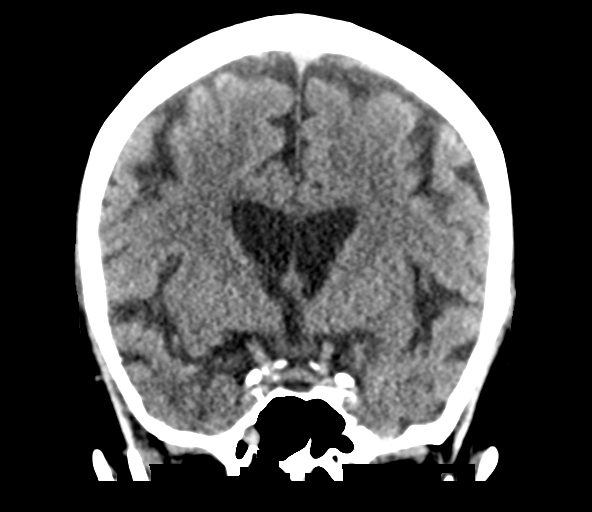
[im 36/65  brain]
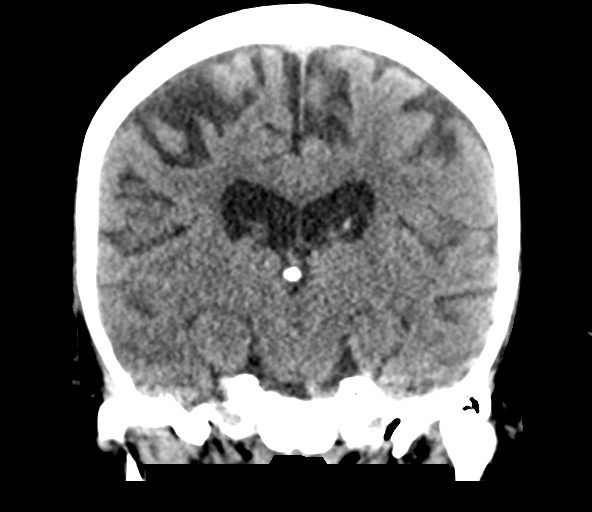

[Series 6: head without sag · sagittal · non-contrast · 0.32mm/px · 3 of 47 slices shown]
[im 16/47  brain]
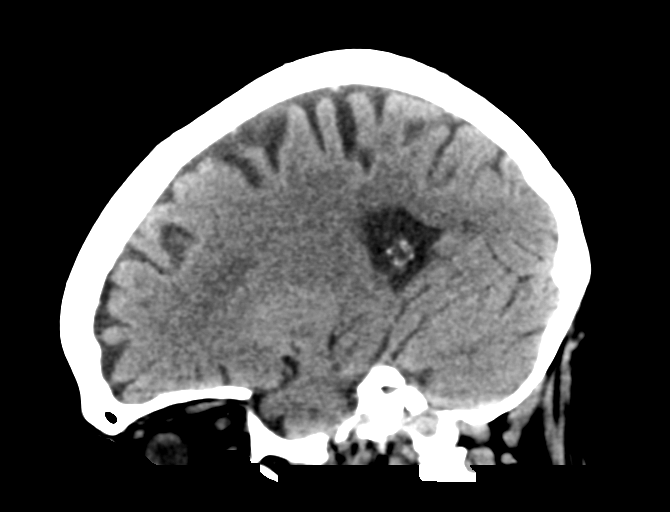
[im 24/47  brain]
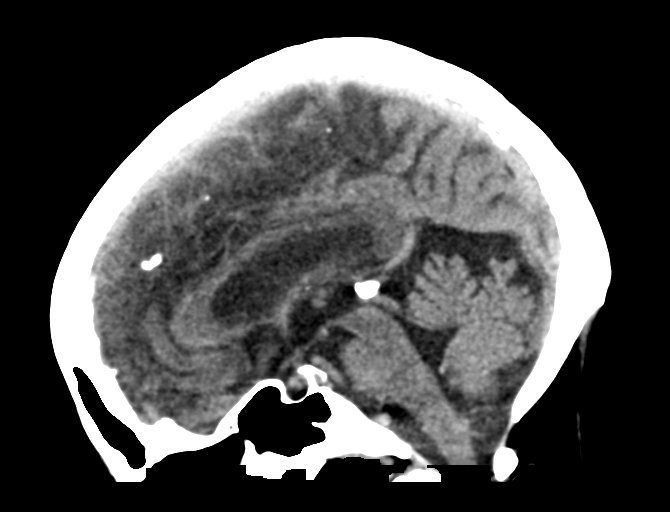
[im 31/47  brain]
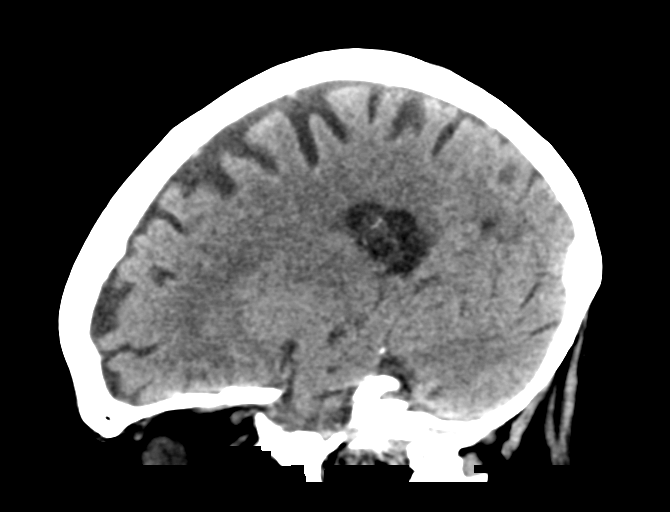

[15 of 47 positions shown; findings below may reference images not displayed]

FINDINGS: CT HEAD FINDINGS

Brain: No evidence of acute infarction, hemorrhage, extra-axial
collection, ventriculomegaly, or mass effect. Generalized cerebral
atrophy. Periventricular white matter low attenuation likely
secondary to microangiopathy.

Vascular: Cerebrovascular atherosclerotic calcifications are noted.

Skull: Negative for fracture or focal lesion.

Sinuses/Orbits: Visualized portions of the orbits are unremarkable.
Visualized portions of the paranasal sinuses and mastoid air cells
are unremarkable.

Other: None.

CT MAXILLOFACIAL FINDINGS

Osseous: No fracture or mandibular dislocation. No destructive
process.

Orbits: Negative. No traumatic or inflammatory finding.

Sinuses: No paranasal sinus air-fluid level. Small left maxillary
sinus mucous retention cyst.

Soft tissues: Mild soft tissue contusion above left orbit. No fluid
collection or hematoma.

CT CERVICAL SPINE FINDINGS

Alignment: 3 mm anterolisthesis of C3 on C4 secondary to facet
disease.

Skull base and vertebrae: No acute fracture. No primary bone lesion
or focal pathologic process.

Soft tissues and spinal canal: No prevertebral fluid or swelling. No
visible canal hematoma.

Disc levels: Degenerative disease with disc height loss at C4-5,
C5-6 and C6-7. At C2-3 there is severe right facet arthropathy. At
C3-4 there is severe left facet arthropathy. At C4-5 there is a
broad-based disc osteophyte complex with mild right foraminal
narrowing. At C5-6 there is a broad-based disc osteophyte complex.

Upper chest: Lung apices are clear.

Other: 14 mm left thyroid hypodense nodule likely reflecting a
colloid cyst.
IMPRESSION: 1. No acute intracranial pathology.
2.  No acute osseous injury of the maxillofacial bones.
3.  No acute osseous injury of the cervical spine.
4. Cervical spine spondylosis as described above.

## 2021-07-27 DIAGNOSIS — Z113 Encounter for screening for infections with a predominantly sexual mode of transmission: Secondary | ICD-10-CM | POA: Diagnosis not present

## 2021-07-27 DIAGNOSIS — I499 Cardiac arrhythmia, unspecified: Secondary | ICD-10-CM | POA: Diagnosis not present

## 2021-07-27 DIAGNOSIS — B372 Candidiasis of skin and nail: Secondary | ICD-10-CM | POA: Diagnosis not present

## 2021-07-27 DIAGNOSIS — R4189 Other symptoms and signs involving cognitive functions and awareness: Secondary | ICD-10-CM | POA: Diagnosis not present

## 2021-07-27 DIAGNOSIS — R159 Full incontinence of feces: Secondary | ICD-10-CM | POA: Diagnosis not present

## 2021-07-27 DIAGNOSIS — Z6835 Body mass index (BMI) 35.0-35.9, adult: Secondary | ICD-10-CM | POA: Diagnosis not present

## 2021-07-27 DIAGNOSIS — R944 Abnormal results of kidney function studies: Secondary | ICD-10-CM | POA: Diagnosis not present

## 2021-07-27 DIAGNOSIS — E669 Obesity, unspecified: Secondary | ICD-10-CM | POA: Diagnosis not present

## 2021-07-27 DIAGNOSIS — I10 Essential (primary) hypertension: Secondary | ICD-10-CM | POA: Diagnosis not present

## 2021-12-04 DIAGNOSIS — G301 Alzheimer's disease with late onset: Secondary | ICD-10-CM | POA: Diagnosis not present

## 2021-12-04 DIAGNOSIS — B356 Tinea cruris: Secondary | ICD-10-CM | POA: Diagnosis not present

## 2021-12-04 DIAGNOSIS — F0280 Dementia in other diseases classified elsewhere without behavioral disturbance: Secondary | ICD-10-CM | POA: Diagnosis not present

## 2021-12-04 DIAGNOSIS — M170 Bilateral primary osteoarthritis of knee: Secondary | ICD-10-CM | POA: Diagnosis not present

## 2021-12-20 DIAGNOSIS — R4189 Other symptoms and signs involving cognitive functions and awareness: Secondary | ICD-10-CM | POA: Diagnosis not present

## 2021-12-20 DIAGNOSIS — Z9181 History of falling: Secondary | ICD-10-CM | POA: Diagnosis not present

## 2021-12-20 DIAGNOSIS — I10 Essential (primary) hypertension: Secondary | ICD-10-CM | POA: Diagnosis not present

## 2021-12-20 DIAGNOSIS — Z139 Encounter for screening, unspecified: Secondary | ICD-10-CM | POA: Diagnosis not present

## 2021-12-20 DIAGNOSIS — R634 Abnormal weight loss: Secondary | ICD-10-CM | POA: Diagnosis not present

## 2021-12-20 DIAGNOSIS — B372 Candidiasis of skin and nail: Secondary | ICD-10-CM | POA: Diagnosis not present

## 2021-12-20 DIAGNOSIS — L720 Epidermal cyst: Secondary | ICD-10-CM | POA: Diagnosis not present

## 2021-12-20 DIAGNOSIS — I498 Other specified cardiac arrhythmias: Secondary | ICD-10-CM | POA: Diagnosis not present

## 2021-12-20 DIAGNOSIS — R944 Abnormal results of kidney function studies: Secondary | ICD-10-CM | POA: Diagnosis not present

## 2022-02-09 DEATH — deceased
